# Patient Record
Sex: Male | Born: 1985 | Race: White | Hispanic: No | Marital: Single
Health system: Southern US, Community
[De-identification: ages and names within clinical notes are randomized; demographics above are authoritative.]

---

## 2015-10-03 ENCOUNTER — Encounter (HOSPITAL_COMMUNITY): Payer: Self-pay | Admitting: Emergency Medicine

## 2015-10-03 ENCOUNTER — Emergency Department (HOSPITAL_COMMUNITY)
Admission: EM | Admit: 2015-10-03 | Discharge: 2015-10-03 | Disposition: A | Discharge status: Home / Self Care | Payer: Worker's Compensation | Attending: Emergency Medicine | Admitting: Emergency Medicine

## 2015-10-03 DIAGNOSIS — S0083XA Contusion of other part of head, initial encounter: Secondary | ICD-10-CM

## 2015-10-03 DIAGNOSIS — X501XXA Overexertion from prolonged static or awkward postures, initial encounter: Secondary | ICD-10-CM | POA: Insufficient documentation

## 2015-10-03 DIAGNOSIS — S01412A Laceration without foreign body of left cheek and temporomandibular area, initial encounter: Secondary | ICD-10-CM | POA: Diagnosis not present

## 2015-10-03 DIAGNOSIS — S50311A Abrasion of right elbow, initial encounter: Secondary | ICD-10-CM | POA: Insufficient documentation

## 2015-10-03 DIAGNOSIS — Z23 Encounter for immunization: Secondary | ICD-10-CM | POA: Insufficient documentation

## 2015-10-03 DIAGNOSIS — Y999 Unspecified external cause status: Secondary | ICD-10-CM | POA: Diagnosis not present

## 2015-10-03 DIAGNOSIS — Y9389 Activity, other specified: Secondary | ICD-10-CM | POA: Insufficient documentation

## 2015-10-03 DIAGNOSIS — F17200 Nicotine dependence, unspecified, uncomplicated: Secondary | ICD-10-CM | POA: Diagnosis not present

## 2015-10-03 DIAGNOSIS — S0181XA Laceration without foreign body of other part of head, initial encounter: Secondary | ICD-10-CM

## 2015-10-03 DIAGNOSIS — S0993XA Unspecified injury of face, initial encounter: Secondary | ICD-10-CM | POA: Diagnosis present

## 2015-10-03 DIAGNOSIS — Y929 Unspecified place or not applicable: Secondary | ICD-10-CM | POA: Insufficient documentation

## 2015-10-03 MED ORDER — TETANUS-DIPHTH-ACELL PERTUSSIS 5-2.5-18.5 LF-MCG/0.5 IM SUSP
0.5000 mL | Freq: Once | INTRAMUSCULAR | Status: AC
  Administered 2015-10-03: 0.5 mL via INTRAMUSCULAR
  Filled 2015-10-03: qty 0.5

## 2015-10-03 MED ORDER — LIDOCAINE-EPINEPHRINE 2 %-1:100000 IJ SOLN
20.0000 mL | Freq: Once | INTRAMUSCULAR | Status: AC
  Administered 2015-10-03: 20 mL via INTRADERMAL
  Filled 2015-10-03: qty 1

## 2015-10-03 NOTE — ED Notes (Signed)
MD at bedside. 

## 2015-10-03 NOTE — ED Provider Notes (Signed)
  [facility]-EMERGENCY DEPT Provider Note   CSN: [id number] Arrival date & time: 10/03/15  0106  First Provider Contact:  First MD Initiated Contact with Patient 10/03/15 0603        History   Chief Complaint Chief Complaint  Patient presents with  . Facial Laceration    HPI David Flynn is a 30 y.o. male who was at work just after midnight this morning when a spring popped and hit him in the face. He has a laceration to his left maxilla. There is minimal associated pain. Bleeding has been controlled with pressure. He is not sure if he is up-to-date on his tetanus. He also has a superficial abrasion to the right elbow. He denies neck pain. He denies loss of consciousness.  HPI  History reviewed. No pertinent past medical history.  There are no active problems to display for this patient.   History reviewed. No pertinent surgical history.     Home Medications    Prior to Admission medications   Not on File    Family History No family history on file.  Social History Social History  Substance Use Topics  . Smoking status: Current Every Day Smoker  . Smokeless tobacco: Never Used  . Alcohol use Not on file     Allergies   Review of patient's allergies indicates no known allergies.   Review of Systems Review of Systems  All other systems reviewed and are negative.    Physical Exam Updated Vital Signs BP 119/89 (BP Location: Right Arm)   Pulse 88   Temp 98.7 F (37.1 C) (Oral)   Resp 18   SpO2 100%   Physical Exam General: Well-developed, well-nourished male in no acute distress; appearance consistent with age of record HENT: normocephalic; Laceration with underlying hematoma left cheek Eyes: pupils equal, round and reactive to light; extraocular muscles intact Neck: supple; nontender Heart: regular rate and rhythm Lungs: clear to auscultation bilaterally Abdomen: soft; nondistended; nontender; no masses or hepatosplenomegaly; bowel sounds  present Extremities: No deformity; full range of motion; pulses normal Neurologic: Awake, alert and oriented; motor function intact in all extremities and symmetric; no facial droop Skin: Warm and dry; superficial abrasion right elbow Psychiatric: Normal mood and affect    ED Treatments / Results    Procedures (including critical care time)  LACERATION REPAIR Performed by: Theador Jezewski L Authorized by: Hanley SeamenMOLPUS,Bran Aldridge L Consent: Verbal consent obtained. Risks and benefits: risks, benefits and alternatives were discussed Consent given by: patient Patient identity confirmed: provided demographic data Prepped and Draped in normal sterile fashion Wound explored  Laceration Location: Left cheek  Laceration Length: 2 cm  No Foreign Bodies seen or palpated  Anesthesia: local infiltration  Local anesthetic: lidocaine 2 % with epinephrine  Anesthetic total: 2 ml  Irrigation method: syringe Amount of cleaning: standard  Skin closure: 5-0 nylon   Number of sutures: 4   Technique: Simple interrupted   Patient tolerance: Patient tolerated the procedure well with no immediate complications.   Final Clinical Impressions(s) / ED Diagnoses   Final diagnoses:  Facial laceration, initial encounter  Facial contusion, initial encounter  Abrasion of right elbow, initial encounter      Paula LibraJohn Alvia Jablonski, MD 10/03/15 0630
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
## 2020-07-23 ENCOUNTER — Emergency Department (HOSPITAL_COMMUNITY): Payer: BC Managed Care – PPO

## 2020-07-23 ENCOUNTER — Emergency Department (HOSPITAL_COMMUNITY)
Admission: EM | Admit: 2020-07-23 | Discharge: 2020-07-23 | Disposition: A | Discharge status: Home / Self Care | Payer: BC Managed Care – PPO | Attending: Emergency Medicine | Admitting: Emergency Medicine

## 2020-07-23 DIAGNOSIS — T1490XA Injury, unspecified, initial encounter: Secondary | ICD-10-CM

## 2020-07-23 DIAGNOSIS — S0001XA Abrasion of scalp, initial encounter: Secondary | ICD-10-CM

## 2020-07-23 DIAGNOSIS — M549 Dorsalgia, unspecified: Secondary | ICD-10-CM | POA: Diagnosis not present

## 2020-07-23 DIAGNOSIS — Z23 Encounter for immunization: Secondary | ICD-10-CM | POA: Diagnosis not present

## 2020-07-23 DIAGNOSIS — Z20822 Contact with and (suspected) exposure to covid-19: Secondary | ICD-10-CM | POA: Diagnosis not present

## 2020-07-23 DIAGNOSIS — R000 Tachycardia, unspecified: Secondary | ICD-10-CM | POA: Insufficient documentation

## 2020-07-23 DIAGNOSIS — Y92410 Unspecified street and highway as the place of occurrence of the external cause: Secondary | ICD-10-CM | POA: Insufficient documentation

## 2020-07-23 DIAGNOSIS — S0990XA Unspecified injury of head, initial encounter: Secondary | ICD-10-CM | POA: Diagnosis present

## 2020-07-23 LAB — COMPREHENSIVE METABOLIC PANEL
ALT: 13 U/L (ref 0–44)
AST: 19 U/L (ref 15–41)
Albumin: 4.6 g/dL (ref 3.5–5.0)
Alkaline Phosphatase: 55 U/L (ref 38–126)
Anion gap: 9 (ref 5–15)
BUN: 9 mg/dL (ref 6–20)
CO2: 26 mmol/L (ref 22–32)
Calcium: 9.6 mg/dL (ref 8.9–10.3)
Chloride: 103 mmol/L (ref 98–111)
Creatinine, Ser: 1.03 mg/dL (ref 0.61–1.24)
GFR, Estimated: >60 mL/min (ref >60)
Glucose, Bld: 122 mg/dL — ABNORMAL HIGH (ref 70–99)
Potassium: 3.4 mmol/L — ABNORMAL LOW (ref 3.5–5.1)
Sodium: 138 mmol/L (ref 135–145)
Total Bilirubin: 0.8 mg/dL (ref 0.3–1.2)
Total Protein: 7.4 g/dL (ref 6.5–8.1)

## 2020-07-23 LAB — TROPONIN I (HIGH SENSITIVITY): Troponin I (High Sensitivity): 23 ng/L — ABNORMAL HIGH (ref <18)

## 2020-07-23 LAB — RESP PANEL BY RT-PCR (FLU A&B, COVID) ARPGX2
Influenza A by PCR: NEGATIVE
Influenza B by PCR: NEGATIVE
SARS Coronavirus 2 by RT PCR: NEGATIVE

## 2020-07-23 LAB — CBC
HCT: 45.3 % (ref 39.0–52.0)
Hemoglobin: 15.1 g/dL (ref 13.0–17.0)
MCH: 30.5 pg (ref 26.0–34.0)
MCHC: 33.3 g/dL (ref 30.0–36.0)
MCV: 91.5 fL (ref 80.0–100.0)
Platelets: 394 10*3/uL (ref 150–400)
RBC: 4.95 MIL/uL (ref 4.22–5.81)
RDW: 12.5 % (ref 11.5–15.5)
WBC: 7.1 10*3/uL (ref 4.0–10.5)
nRBC: 0 % (ref 0.0–0.2)

## 2020-07-23 LAB — PROTIME-INR
INR: 1.1 (ref 0.8–1.2)
Prothrombin Time: 13.8 seconds (ref 11.4–15.2)

## 2020-07-23 MED ORDER — NAPROXEN 375 MG PO TABS: 375.0000 mg | ORAL_TABLET | Freq: Two times a day (BID) | ORAL | 0 refills | Status: AC

## 2020-07-23 MED ORDER — SODIUM CHLORIDE 0.9 % IV BOLUS
1000.0000 mL | Freq: Once | INTRAVENOUS | Status: AC
  Administered 2020-07-23: 1000 mL via INTRAVENOUS

## 2020-07-23 MED ORDER — METHOCARBAMOL 500 MG PO TABS: 500.0000 mg | ORAL_TABLET | Freq: Two times a day (BID) | ORAL | 0 refills | Status: AC

## 2020-07-23 MED ORDER — TETANUS-DIPHTH-ACELL PERTUSSIS 5-2.5-18.5 LF-MCG/0.5 IM SUSY
0.5000 mL | PREFILLED_SYRINGE | Freq: Once | INTRAMUSCULAR | Status: AC
  Administered 2020-07-23: 0.5 mL via INTRAMUSCULAR
  Filled 2020-07-23: qty 0.5
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
## 2020-07-23 NOTE — Discharge Instructions (Addendum)

## 2020-07-23 NOTE — ED Notes (Signed)
Patient transported to CT 
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
## 2020-07-23 NOTE — ED Notes (Signed)
Pt ambulated to bathroom with a steady gate.

## 2021-01-05 ENCOUNTER — Emergency Department (HOSPITAL_COMMUNITY)
Admission: EM | Admit: 2021-01-05 | Discharge: 2021-01-06 | Disposition: A | Discharge status: Home / Self Care | Payer: No Typology Code available for payment source | Attending: Emergency Medicine | Admitting: Emergency Medicine

## 2021-01-05 ENCOUNTER — Other Ambulatory Visit: Payer: Self-pay

## 2021-01-05 DIAGNOSIS — W228XXA Striking against or struck by other objects, initial encounter: Secondary | ICD-10-CM | POA: Insufficient documentation

## 2021-01-05 DIAGNOSIS — F17200 Nicotine dependence, unspecified, uncomplicated: Secondary | ICD-10-CM | POA: Diagnosis not present

## 2021-01-05 DIAGNOSIS — S0990XA Unspecified injury of head, initial encounter: Secondary | ICD-10-CM | POA: Diagnosis present

## 2021-01-05 DIAGNOSIS — S0101XA Laceration without foreign body of scalp, initial encounter: Secondary | ICD-10-CM | POA: Diagnosis not present

## 2021-01-05 DIAGNOSIS — Y990 Civilian activity done for income or pay: Secondary | ICD-10-CM | POA: Insufficient documentation

## 2021-01-05 NOTE — ED Provider Notes (Signed)
Emergency Medicine Provider Triage Evaluation Note  David Flynn , a 35 y.o. male  was evaluated in triage.  Pt complains of head injury sustained at work today around [employer]. Patient states he was walking around a conveyer belt and there was a sharp piece of metal from a contact with his forehead.  He states that several employees have had similar injuries recently, and is here for [employer].  There is no loss of consciousness, he is complaining of some mild neck pain.  Laceration is covered with a Band-Aid, bleeding is controlled. Last tetanus in June 2022.   Review of Systems  Positive: Head laceration, neck pain Negative: Dizziness, syncope, vision changes  Physical Exam  There were no vitals taken for this visit. Gen:   Awake, no distress   Resp:  Normal effort  MSK:   Moves extremities without difficulty  Other:  Approximately 5 mm laceration over the forehead at the scalp line, minimal bleeding, no obvious deformities palptated  Medical Decision Making  Medically screening exam initiated at 5:28 PM.  Appropriate orders placed.  David Flynn was informed that the remainder of the evaluation will be completed by another provider, this initial triage assessment does not replace that evaluation, and the importance of remaining in the ED until their evaluation is complete.     Jeanella Flattery 01/05/21 1730    Terrilee Files, MD 01/05/21 [phone]

## 2021-01-05 NOTE — ED Triage Notes (Signed)
Pt here POV d/t head laceration. Pt head hit a piece a metal at work. No LOC, No dizziness. Head and neck pain. Alert and oriented X4. Bleeding controlled. Laceration covered with band aide.

## 2021-01-06 NOTE — ED Provider Notes (Signed)
[facility] EMERGENCY DEPARTMENT Provider Note   CSN: [id number] Arrival date & time: 01/05/21  1546     History Chief Complaint  Patient presents with   Head Laceration    David Flynn is a 35 y.o. male.  HPI  Patient presents with head laceration.  This happened acutely at work, he walked into a metal piece at the [employer].  It caused bleeding and pain which was initially severe but has subsided with time.  He did not lose consciousness or have any syncopal event, not having any dizziness, nausea, vomiting.  Denies any headaches or vision changes.  He has not tried any alleviating medicines, denies any aggravating factors.  He is up-to-date on his tetanus last June 2022.  No past medical history on file.  There are no problems to display for this patient.   No past surgical history on file.     No family history on file.  Social History   Tobacco Use   Smoking status: Every Day   Smokeless tobacco: Never    Home Medications Prior to Admission medications   Medication Sig Start Date End Date Taking? Authorizing Provider  ibuprofen (ADVIL) 200 MG tablet Take 400 mg by mouth every 6 (six) hours as needed for headache or moderate pain.   Yes [provider]  Multiple Vitamin (MULTIVITAMIN) tablet Take 1 tablet by mouth daily.   Yes [provider]  methocarbamol (ROBAXIN) 500 MG tablet Take 1 tablet (500 mg total) by mouth 2 (two) times daily. Patient not taking: Reported on 01/06/2021 07/23/20   Arthor Captain, PA-C  naproxen (NAPROSYN) 375 MG tablet Take 1 tablet (375 mg total) by mouth 2 (two) times daily with a meal. Patient not taking: Reported on 01/06/2021 07/23/20   Arthor Captain, PA-C    Allergies    Patient has no known allergies.  Review of Systems   Review of Systems  Constitutional:  Negative for fever.  Skin:  Positive for wound.  Neurological:  Negative for syncope.   Physical Exam Updated Vital  Signs BP 116/85 (BP Location: Right Arm)   Pulse 91   Temp 97.8 F (36.6 C) (Oral)   Resp 18   Ht 5\' 6"  (1.676 m)   Wt 59 kg   SpO2 100%   BMI 20.98 kg/m   Physical Exam Vitals and nursing note reviewed. Exam conducted with a chaperone present.  Constitutional:      Appearance: Normal appearance.  HENT:     Head: Normocephalic.     Comments: 5 mm laceration, not actively bleeding and appears to have closed with secondary healing. Eyes:     General: No scleral icterus.       Right eye: No discharge.        Left eye: No discharge.     Extraocular Movements: Extraocular movements intact.     Pupils: Pupils are equal, round, and reactive to light.  Cardiovascular:     Rate and Rhythm: Normal rate and regular rhythm.     Pulses: Normal pulses.     Heart sounds: Normal heart sounds. No murmur heard.   No friction rub. No gallop.  Pulmonary:     Effort: Pulmonary effort is normal. No respiratory distress.     Breath sounds: Normal breath sounds.  Abdominal:     General: Abdomen is flat. Bowel sounds are normal. There is no distension.     Palpations: Abdomen is soft.     Tenderness: There is  no abdominal tenderness.  Skin:    General: Skin is warm and dry.     Coloration: Skin is not jaundiced.  Neurological:     Mental Status: He is alert. Mental status is at baseline.     Coordination: Coordination normal.     Comments: Cranial nerves III through XII are grossly intact.  Grip strength is equal bilaterally, lower extremity strength equal bilaterally     ED Results / Procedures / Treatments   Labs (all labs ordered are listed, but only abnormal results are displayed) Labs Reviewed - No data to display  EKG None  Radiology No results found.  Procedures Procedures   Medications Ordered in ED Medications - No data to display  ED Course  I have reviewed the triage vital signs and the nursing notes.  Pertinent labs & imaging results that were available during my  care of the patient were reviewed by me and considered in my medical decision making (see chart for details).    MDM Rules/Calculators/A&P                           Stable vitals, patient nontoxic-appearing.  Normal neuro exam, patient been observed for over 18 hours in the waiting room and has not had any syncopal events or new symptoms.  His tetanus is up-to-date, I do not think CT head would be beneficial especially in the context of negative CT [place] head rules.  His age and lack of blood thinners make acute injury less likely.  Given its been over 18 and half hours since the wound occurred will not close, also appears to be well-healing on its own.  Please see photo above.  Wound irrigated and cleansed, will allow to heal through secondary healing.  Patient discharged in stable position.  Final Clinical Impression(s) / ED Diagnoses Final diagnoses:  None    Rx / DC Orders ED Discharge Orders     None        Theron Arista, PA-C 01/06/21 1022    Wynetta Fines, MD 01/10/21 [phone]

## 2021-01-06 NOTE — Discharge Instructions (Addendum)
Wash with soap and water. Return if you have vomiting, vision changes or severe headache.

## 2021-04-15 DIAGNOSIS — Z1329 Encounter for screening for other suspected endocrine disorder: Secondary | ICD-10-CM | POA: Diagnosis not present

## 2021-04-15 DIAGNOSIS — Z13220 Encounter for screening for lipoid disorders: Secondary | ICD-10-CM | POA: Diagnosis not present

## 2021-04-15 DIAGNOSIS — Z0000 Encounter for general adult medical examination without abnormal findings: Secondary | ICD-10-CM | POA: Diagnosis not present

## 2021-06-01 DIAGNOSIS — Z3009 Encounter for other general counseling and advice on contraception: Secondary | ICD-10-CM | POA: Diagnosis not present

## 2021-06-09 ENCOUNTER — Ambulatory Visit
Admission: EM | Admit: 2021-06-09 | Discharge: 2021-06-09 | Disposition: A | Discharge status: Home / Self Care | Payer: BC Managed Care – PPO | Attending: Internal Medicine | Admitting: Internal Medicine

## 2021-06-09 ENCOUNTER — Other Ambulatory Visit: Payer: Self-pay

## 2021-06-09 ENCOUNTER — Encounter: Payer: Self-pay | Admitting: Emergency Medicine

## 2021-06-09 DIAGNOSIS — J029 Acute pharyngitis, unspecified: Secondary | ICD-10-CM | POA: Diagnosis not present

## 2021-06-09 DIAGNOSIS — B349 Viral infection, unspecified: Secondary | ICD-10-CM | POA: Insufficient documentation

## 2021-06-09 LAB — POCT RAPID STREP A (OFFICE): Rapid Strep A Screen: NEGATIVE

## 2021-06-09 NOTE — Discharge Instructions (Signed)
Strep test was negative.  Throat culture is pending.  We will call if it is positive.  It appears that you have a viral illness that should run its course and self resolve in the next few days with symptomatic treatment.  Recommend over-the-counter cough and cold medications as well as throat lozenges. ?

## 2021-06-09 NOTE — ED Triage Notes (Signed)
Patient c/o fever x 2 days, some sore throat, son has been sick with fever and given an antb.  No official diagnosis.  Patient has taken Ibuprofen. ?

## 2021-06-09 NOTE — ED Provider Notes (Signed)
?[facility] ? ? ? ?CSN: [id number] ?Arrival date & time: 06/09/21  1400 ? ? ?  ? ?History   ?Chief Complaint ?Chief Complaint  ?Patient presents with  ? Fever  ? ? ?HPI ?David Flynn is a 36 y.o. male.  ? ?Patient presents with fever and sore throat that has been present for approximately 2 days.  Denies any associated upper respiratory symptoms or cough.  His son has had similar symptoms recently.  Tmax at home was 103.  Denies chest pain, shortness of breath, ear pain, nausea, vomiting, diarrhea, abdominal pain.  Patient is taking ibuprofen. ? ? ?Fever ? ?History reviewed. No pertinent past medical history. ? ?There are no problems to display for this patient. ? ? ?History reviewed. No pertinent surgical history. ? ? ? ? ?Home Medications   ? ?Prior to Admission medications   ?Medication Sig Start Date End Date Taking? Authorizing Provider  ?ibuprofen (ADVIL) 200 MG tablet Take 400 mg by mouth every 6 (six) hours as needed for headache or moderate pain.    [provider]  ?methocarbamol (ROBAXIN) 500 MG tablet Take 1 tablet (500 mg total) by mouth 2 (two) times daily. ?Patient not taking: Reported on 01/06/2021 07/23/20   Arthor Captain, PA-C  ?Multiple Vitamin (MULTIVITAMIN) tablet Take 1 tablet by mouth daily.    [provider]  ?naproxen (NAPROSYN) 375 MG tablet Take 1 tablet (375 mg total) by mouth 2 (two) times daily with a meal. ?Patient not taking: Reported on 01/06/2021 07/23/20   Arthor Captain, PA-C  ? ? ?Family History ?History reviewed. No pertinent family history. ? ?Social History ?Social History  ? ?Tobacco Use  ? Smoking status: Former  ?  Types: Cigarettes  ? Smokeless tobacco: Current  ?Vaping Use  ? Vaping Use: Some days  ?Substance Use Topics  ? Alcohol use: Never  ? Drug use: Never  ? ? ? ?Allergies   ?Patient has no known allergies. ? ? ?Review of Systems ?Review of Systems ?Per HPI ? ?Physical Exam ?Triage Vital Signs ?ED Triage Vitals  ?Enc Vitals Group  ?    BP 06/09/21 1540 100/61  ?   Pulse Rate 06/09/21 1540 96  ?   Resp 06/09/21 1540 18  ?   Temp 06/09/21 1540 98.8 ?F (37.1 ?C)  ?   Temp Source 06/09/21 1540 Oral  ?   SpO2 06/09/21 1540 98 %  ?   Weight 06/09/21 1542 130 lb 1.1 oz (59 kg)  ?   Height 06/09/21 1542 5\' 6"  (1.676 m)  ?   Head Circumference --   ?   Peak Flow --   ?   Pain Score 06/09/21 1542 0  ?   Pain Loc --   ?   Pain Edu? --   ?   Excl. in GC? --   ? ?No data found. ? ?Updated Vital Signs ?BP 100/61 (BP Location: Left Arm)   Pulse 96   Temp 98.8 ?F (37.1 ?C) (Oral)   Resp 18   Ht 5\' 6"  (1.676 m)   Wt 130 lb 1.1 oz (59 kg)   SpO2 98%   BMI 20.99 kg/m?  ? ?Visual Acuity ?Right Eye Distance:   ?Left Eye Distance:   ?Bilateral Distance:   ? ?Right Eye Near:   ?Left Eye Near:    ?Bilateral Near:    ? ?Physical Exam ?Constitutional:   ?   General: He is not in acute distress. ?   Appearance: Normal  appearance. He is not toxic-appearing or diaphoretic.  ?HENT:  ?   Head: Normocephalic and atraumatic.  ?   Right Ear: Tympanic membrane and ear canal normal.  ?   Left Ear: Tympanic membrane and ear canal normal.  ?   Nose: Congestion present.  ?   Mouth/Throat:  ?   Mouth: Mucous membranes are moist.  ?   Pharynx: Posterior oropharyngeal erythema present.  ?Eyes:  ?   Extraocular Movements: Extraocular movements intact.  ?   Conjunctiva/sclera: Conjunctivae normal.  ?   Pupils: Pupils are equal, round, and reactive to light.  ?Cardiovascular:  ?   Rate and Rhythm: Normal rate and regular rhythm.  ?   Pulses: Normal pulses.  ?   Heart sounds: Normal heart sounds.  ?Pulmonary:  ?   Effort: Pulmonary effort is normal. No respiratory distress.  ?   Breath sounds: Normal breath sounds. No stridor. No wheezing, rhonchi or rales.  ?Abdominal:  ?   General: Abdomen is flat. Bowel sounds are normal.  ?   Palpations: Abdomen is soft.  ?Musculoskeletal:     ?   General: Normal range of motion.  ?   Cervical back: Normal range of motion.  ?Skin: ?   General: Skin  is warm and dry.  ?Neurological:  ?   General: No focal deficit present.  ?   Mental Status: He is alert and oriented to person, place, and time. Mental status is at baseline.  ?Psychiatric:     ?   Mood and Affect: Mood normal.     ?   Behavior: Behavior normal.  ? ? ? ?UC Treatments / Results  ?Labs ?(all labs ordered are listed, but only abnormal results are displayed) ?Labs Reviewed  ?CULTURE, GROUP A STREP [facility])  ?POCT RAPID STREP A (OFFICE)  ? ? ?EKG ? ? ?Radiology ?No results found. ? ?Procedures ?Procedures (including critical care time) ? ?Medications Ordered in UC ?Medications - No data to display ? ?Initial Impression / Assessment and Plan / UC Course  ?I have reviewed the triage vital signs and the nursing notes. ? ?Pertinent labs & imaging results that were available during my care of the patient were reviewed by me and considered in my medical decision making (see chart for details). ? ?  ? ?Patient's symptoms appear viral in etiology.  Rapid strep is negative.  Throat culture is pending.  Recommended supportive care and symptom management over-the-counter for patient.  Fever monitoring and management discussed with patient.  Discussed return precautions.  No signs of peritonsillar abscess on exam.  Patient verbalized understanding and was agreeable with plan.  ?Final Clinical Impressions(s) / UC Diagnoses  ? ?Final diagnoses:  ?Viral illness  ?Sore throat  ? ? ? ?Discharge Instructions   ? ?  ?Strep test was negative.  Throat culture is pending.  We will call if it is positive.  It appears that you have a viral illness that should run its course and self resolve in the next few days with symptomatic treatment.  Recommend over-the-counter cough and cold medications as well as throat lozenges. ? ? ? ? ?ED Prescriptions   ?None ?  ? ?PDMP not reviewed this encounter. ?  ?Gustavus Bryant, [place] ?06/09/21 1616 ? ?

## 2021-06-12 ENCOUNTER — Telehealth (HOSPITAL_COMMUNITY): Payer: Self-pay | Admitting: Emergency Medicine

## 2021-06-12 LAB — CULTURE, GROUP A STREP (THRC)

## 2021-06-12 MED ORDER — AZITHROMYCIN 250 MG PO TABS: 250.0000 mg | ORAL_TABLET | Freq: Every day | ORAL | 0 refills | Status: AC

## 2021-06-25 DIAGNOSIS — Z302 Encounter for sterilization: Secondary | ICD-10-CM | POA: Diagnosis not present
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
